# Patient Record
Sex: Male | Born: 1960 | Hispanic: No | Marital: Single | Smoking: Current every day smoker
Health system: Southern US, Community
[De-identification: ages and names within clinical notes are randomized; demographics above are authoritative.]

## PROBLEM LIST (undated history)

## (undated) HISTORY — PX: NO PAST SURGERIES: SHX2092

---

## 2018-06-10 ENCOUNTER — Other Ambulatory Visit: Payer: Self-pay

## 2018-06-10 ENCOUNTER — Encounter (HOSPITAL_COMMUNITY): Payer: Self-pay

## 2018-06-10 ENCOUNTER — Emergency Department (HOSPITAL_COMMUNITY): Payer: Self-pay

## 2018-06-10 ENCOUNTER — Emergency Department (HOSPITAL_COMMUNITY)
Admission: EM | Admit: 2018-06-10 | Discharge: 2018-06-10 | Disposition: A | Discharge status: Home / Self Care | Payer: Self-pay | Attending: Emergency Medicine | Admitting: Emergency Medicine

## 2018-06-10 DIAGNOSIS — Z23 Encounter for immunization: Secondary | ICD-10-CM | POA: Insufficient documentation

## 2018-06-10 DIAGNOSIS — Y9389 Activity, other specified: Secondary | ICD-10-CM | POA: Insufficient documentation

## 2018-06-10 DIAGNOSIS — W230XXA Caught, crushed, jammed, or pinched between moving objects, initial encounter: Secondary | ICD-10-CM | POA: Insufficient documentation

## 2018-06-10 DIAGNOSIS — S61215A Laceration without foreign body of left ring finger without damage to nail, initial encounter: Secondary | ICD-10-CM | POA: Insufficient documentation

## 2018-06-10 DIAGNOSIS — Y999 Unspecified external cause status: Secondary | ICD-10-CM | POA: Insufficient documentation

## 2018-06-10 DIAGNOSIS — Y929 Unspecified place or not applicable: Secondary | ICD-10-CM | POA: Insufficient documentation

## 2018-06-10 MED ORDER — BACITRACIN ZINC 500 UNIT/GM EX OINT
1.0000 "application " | TOPICAL_OINTMENT | Freq: Two times a day (BID) | CUTANEOUS | Status: DC
  Administered 2018-06-10: 1 via TOPICAL
  Filled 2018-06-10: qty 0.9

## 2018-06-10 MED ORDER — TETANUS-DIPHTH-ACELL PERTUSSIS 5-2.5-18.5 LF-MCG/0.5 IM SUSP
0.5000 mL | Freq: Once | INTRAMUSCULAR | Status: AC
  Administered 2018-06-10: 0.5 mL via INTRAMUSCULAR
  Filled 2018-06-10: qty 0.5

## 2018-06-10 MED ORDER — LIDOCAINE HCL (PF) 1 % IJ SOLN
30.0000 mL | Freq: Once | INTRAMUSCULAR | Status: AC
  Administered 2018-06-10: 30 mL
  Filled 2018-06-10: qty 30

## 2018-06-10 NOTE — Discharge Instructions (Signed)
Return in 7-10 days for suture removal.  Keep the wound clean and dry.

## 2018-06-10 NOTE — ED Triage Notes (Signed)
Pt arrive POV d/t L. Ring finger lac from machinery

## 2018-06-10 NOTE — ED Provider Notes (Signed)
[facility]   CSN: [id number] Arrival date & time: 06/10/18  [id number]     History   Chief Complaint Chief Complaint  Patient presents with  . Extremity Laceration    L. ring Finger    HPI Alan Stevens is a 57 y.o. male.  Patient presents to the emergency department with a chief complaint of left ring finger laceration.  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  He complains of moderate pain.  Last tetanus shot unknown.  Bleeding controlled with gauze.  The history is provided by the patient. No language interpreter was used.    No past medical history on file.  There are no active problems to display for this patient.   History reviewed. No pertinent surgical history.      Home Medications    Prior to Admission medications   Not on File    Family History No family history on file.  Social History Social History   Tobacco Use  . Smoking status: Not on file  Substance Use Topics  . Alcohol use: Not on file  . Drug use: Not on file     Allergies   Patient has no allergy information on record.   Review of Systems Review of Systems  All other systems reviewed and are negative.    Physical Exam Updated Vital Signs Pulse (!) 101   Temp 98.3 F (36.8 C) (Oral)   Resp (!) 108   SpO2 99%   Physical Exam Nursing note and vitals reviewed.  Constitutional: Pt appears well-developed and well-nourished. No distress.  HENT:  Head: Normocephalic and atraumatic.  Eyes: Conjunctivae are normal.  Neck: Normal range of motion.  Cardiovascular: Normal rate, regular rhythm. Intact distal pulses.   Capillary refill < 3 sec.  Pulmonary/Chest: Effort normal and breath sounds normal.  Musculoskeletal:  Left hand Pt exhibits laceration to the base of the left ring finger anteriorly.   ROM: 5/5  Strength: 5/5  Neurological: Pt  is  alert. Coordination normal.  Sensation: 5/5 Skin: 1.5 cm laceration to the anterior base of the left 4th finger, no FB, no visible tendon or joint involvement Psychiatric: Pt has a normal mood and affect.     ED Treatments / Results  Labs (all labs ordered are listed, but only abnormal results are displayed) Labs Reviewed - No data to display  EKG None  Radiology No results found.  Procedures Procedures (including critical care time) LACERATION REPAIR Performed by: Evelena LeydenGarrett Green, PA-S Authorized by: Roxy Horsemanobert Tenzin Edelman Consent: Verbal consent obtained. Risks and benefits: risks, benefits and alternatives were discussed Consent given by: patient Patient identity confirmed: provided demographic data Prepped and Draped in normal sterile fashion Wound explored  Laceration Location: 4th finger  Laceration Length: 1.5cm  No Foreign Bodies seen or palpated  Anesthesia: Digital block  Local anesthetic: lidocaine 1% without epinephrine  Anesthetic total: 5 ml  Irrigation method: syringe Amount of cleaning: standard  Skin closure: 4-0 prolene  Number of sutures: 4  Technique: interrupted  Patient tolerance: Patient tolerated the procedure well with no immediate complications.  Medications Ordered in ED Medications  Tdap (BOOSTRIX) injection 0.5 mL (has no administration in time range)  lidocaine (PF) (XYLOCAINE) 1 % injection 30 mL (has no administration in time range)     Initial Impression / Assessment and Plan / ED Course  I have reviewed the triage vital signs and  the nursing notes.  Pertinent labs & imaging results that were available during my care of the patient were reviewed by me and considered in my medical decision making (see chart for details).     Patient with laceration of the left 4th finger.  Lac repaired in ED.  No obvious tendon involvement.  Normal ROM and strength.  Tdap updated.  Sutures out in 7-10 days.  Final Clinical Impressions(s) / ED  Diagnoses   Final diagnoses:  Laceration of left ring finger without foreign body without damage to nail, initial encounter    ED Discharge Orders    None       Roxy Horseman, PA-C 06/10/18 [id number]    Dione Booze, MD 06/10/18 [phone]

## 2018-06-21 ENCOUNTER — Emergency Department (HOSPITAL_COMMUNITY)
Admission: EM | Admit: 2018-06-21 | Discharge: 2018-06-21 | Disposition: A | Discharge status: Home / Self Care | Payer: Self-pay | Attending: Emergency Medicine | Admitting: Emergency Medicine

## 2018-06-21 DIAGNOSIS — Z4802 Encounter for removal of sutures: Secondary | ICD-10-CM | POA: Insufficient documentation

## 2018-06-21 DIAGNOSIS — F17210 Nicotine dependence, cigarettes, uncomplicated: Secondary | ICD-10-CM | POA: Insufficient documentation

## 2018-06-21 NOTE — Discharge Instructions (Signed)
Please read and follow all provided instructions.  Your diagnoses today include:  1. Visit for suture removal     Tests performed today include:  Vital signs. See below for your results today.   Medications prescribed:   None  Take any prescribed medications only as directed.   Home care instructions:  Follow any educational materials and wound care instructions contained in this packet.   Follow-up instructions: Please contact the orthopedic hand doctor referral if you have any concerns about your finger being weak.  Return instructions:  Return to the Emergency Department if you have:  Fever  Worsening pain  Worsening swelling of the wound  Pus draining from the wound  Redness of the skin that moves away from the wound, especially if it streaks away from the affected area   Any other emergent concerns  Your vital signs today were: BP (!) 142/86 (BP Location: Right Arm)    Pulse 81    Temp 98.9 F (37.2 C) (Oral)    Resp 18    Ht 6' (1.829 m)    SpO2 100%    BMI 24.41 kg/m  If your blood pressure (BP) was elevated above 135/85 this visit, please have this repeated by your doctor within one month. --------------

## 2018-06-21 NOTE — ED Notes (Signed)
Declined W/C at D/C and was escorted to lobby by RN. 

## 2018-06-21 NOTE — ED Provider Notes (Signed)
[facility] EMERGENCY DEPARTMENT Provider Note   CSN: [id number] Arrival date & time: 06/21/18  1833     History   Chief Complaint No chief complaint on file.   HPI Alan Stevens is a 57 y.o. male.  Patient presents emergency department for suture removal.  Patient was seen in the emergency department 11 days ago.  He sustained a laceration to the base of his left ring finger.  He states that the wound has been healing well.  He states that he has had persistent weakness in flexion and the ring finger.  He also has some decreased sensation at the fingertip itself.  No redness or drainage.  Onset of symptoms acute.  Course is improving.     No past medical history on file.  There are no active problems to display for this patient.   No past surgical history on file.      Home Medications    Prior to Admission medications   Not on File    Family History No family history on file.  Social History Social History   Tobacco Use  . Smoking status: Current Every Alan Smoker    Packs/Alan: 0.50    Types: Cigarettes  . Smokeless tobacco: Never Used  Substance Use Topics  . Alcohol use: Yes    Alcohol/week: 1.0 standard drinks    Types: 1 Cans of beer per week  . Drug use: Not on file     Allergies   Patient has no allergy information on record.   Review of Systems Review of Systems  Constitutional: Negative for activity change.  Musculoskeletal: Negative for arthralgias, back pain, gait problem, joint swelling and neck pain.  Skin: Positive for wound.  Neurological: Positive for weakness. Negative for numbness.     Physical Exam Updated Vital Signs BP (!) 142/86 (BP Location: Right Arm)   Pulse 81   Temp 98.9 F (37.2 C) (Oral)   Resp 18   Ht 6' (1.829 m)   SpO2 100%   BMI 24.41 kg/m   Physical Exam  Constitutional: He appears well-developed and well-nourished.  HENT:  Head: Normocephalic and atraumatic.  Eyes: Conjunctivae  are normal.  Neck: Normal range of motion. Neck supple.  Cardiovascular: Normal pulses. Exam reveals no decreased pulses.  Musculoskeletal: He exhibits tenderness. He exhibits no edema.  Patient with slightly decreased strength against active resistance in the ring finger on the left hand.  Wound appears to be healing well.  Patient with normal range of motion.  He does feel sharp and dull along the course of the finger bilaterally.  No full numbness detected.  Neurological: He is alert. A sensory deficit (Reports decreased sensation) is present.  Skin: Skin is warm and dry.  Psychiatric: He has a normal mood and affect.  Nursing note and vitals reviewed.    ED Treatments / Results  Labs (all labs ordered are listed, but only abnormal results are displayed) Labs Reviewed - No data to display  EKG None  Radiology No results found.  Procedures .Suture Removal Date/Time: 06/21/2018 7:12 PM Performed by: Renne Crigler, PA-C Authorized by: Renne Crigler, PA-C   Consent:    Consent obtained:  Verbal   Consent given by:  Patient   Risks discussed:  Pain Procedure details:    Wound appearance:  No signs of infection, clean and good wound healing   Number of sutures removed:  4 Post-procedure details:    Post-removal:  No dressing applied   Patient  tolerance of procedure:  Tolerated well, no immediate complications   (including critical care time)  Medications Ordered in ED Medications - No data to display   Initial Impression / Assessment and Plan / ED Course  I have reviewed the triage vital signs and the nursing notes.  Pertinent labs & imaging results that were available during my care of the patient were reviewed by me and considered in my medical decision making (see chart for details).     Patient seen and examined.  Reviewed note from previous visit.  No tendon injury seen at time of exam.  Patient does have some residual weakness when compared to other fingers  today.  He has some decreased sensation in the finger but no full numbness that I can elicit.  Sutures removed without complication.  Discussed continued wound care.  Encouraged patient to follow-up with orthopedic hand physician for another opinion if he is concerned about any residual weakness.  Referral given.  Vital signs reviewed and are as follows: BP (!) 142/86 (BP Location: Right Arm)   Pulse 81   Temp 98.9 F (37.2 C) (Oral)   Resp 18   Ht 6' (1.829 m)   SpO2 100%   BMI 24.41 kg/m     Final Clinical Impressions(s) / ED Diagnoses   Final diagnoses:  Visit for suture removal   Patient with suture removal without complication.  Wound appears to be healing very well.  Follow-up as discussed above.  ED Discharge Orders    None       Renne CriglerGeiple, Seng Larch, Cordelia Poche-C 06/21/18 [id number]    Marily MemosMesner, Jason, MD 06/21/18 2022

## 2020-10-10 ENCOUNTER — Observation Stay (HOSPITAL_COMMUNITY)
Admission: EM | Admit: 2020-10-10 | Discharge: 2020-10-12 | Disposition: A | Discharge status: Home / Self Care | Payer: 59 | Attending: Surgery | Admitting: Surgery

## 2020-10-10 ENCOUNTER — Emergency Department (HOSPITAL_COMMUNITY): Payer: 59

## 2020-10-10 ENCOUNTER — Other Ambulatory Visit: Payer: Self-pay

## 2020-10-10 ENCOUNTER — Encounter (HOSPITAL_COMMUNITY): Payer: Self-pay | Admitting: Emergency Medicine

## 2020-10-10 DIAGNOSIS — S02611A Fracture of condylar process of right mandible, initial encounter for closed fracture: Secondary | ICD-10-CM | POA: Insufficient documentation

## 2020-10-10 DIAGNOSIS — S2242XA Multiple fractures of ribs, left side, initial encounter for closed fracture: Secondary | ICD-10-CM

## 2020-10-10 DIAGNOSIS — S50812A Abrasion of left forearm, initial encounter: Secondary | ICD-10-CM

## 2020-10-10 DIAGNOSIS — S0993XA Unspecified injury of face, initial encounter: Secondary | ICD-10-CM | POA: Diagnosis present

## 2020-10-10 DIAGNOSIS — J939 Pneumothorax, unspecified: Secondary | ICD-10-CM | POA: Insufficient documentation

## 2020-10-10 DIAGNOSIS — F17210 Nicotine dependence, cigarettes, uncomplicated: Secondary | ICD-10-CM | POA: Diagnosis not present

## 2020-10-10 DIAGNOSIS — S00411A Abrasion of right ear, initial encounter: Secondary | ICD-10-CM

## 2020-10-10 DIAGNOSIS — S2243XA Multiple fractures of ribs, bilateral, initial encounter for closed fracture: Secondary | ICD-10-CM | POA: Diagnosis not present

## 2020-10-10 DIAGNOSIS — S0181XA Laceration without foreign body of other part of head, initial encounter: Principal | ICD-10-CM | POA: Insufficient documentation

## 2020-10-10 DIAGNOSIS — Z20822 Contact with and (suspected) exposure to covid-19: Secondary | ICD-10-CM | POA: Diagnosis not present

## 2020-10-10 DIAGNOSIS — S80211A Abrasion, right knee, initial encounter: Secondary | ICD-10-CM

## 2020-10-10 DIAGNOSIS — K769 Liver disease, unspecified: Secondary | ICD-10-CM

## 2020-10-10 DIAGNOSIS — J9690 Respiratory failure, unspecified, unspecified whether with hypoxia or hypercapnia: Secondary | ICD-10-CM

## 2020-10-10 DIAGNOSIS — E041 Nontoxic single thyroid nodule: Secondary | ICD-10-CM

## 2020-10-10 DIAGNOSIS — R030 Elevated blood-pressure reading, without diagnosis of hypertension: Secondary | ICD-10-CM | POA: Insufficient documentation

## 2020-10-10 DIAGNOSIS — S2249XA Multiple fractures of ribs, unspecified side, initial encounter for closed fracture: Secondary | ICD-10-CM | POA: Diagnosis present

## 2020-10-10 DIAGNOSIS — S02610B Fracture of condylar process of mandible, unspecified side, initial encounter for open fracture: Secondary | ICD-10-CM

## 2020-10-10 DIAGNOSIS — S2241XA Multiple fractures of ribs, right side, initial encounter for closed fracture: Secondary | ICD-10-CM

## 2020-10-10 MED ORDER — LIDOCAINE-EPINEPHRINE 1 %-1:100000 IJ SOLN
20.0000 mL | Freq: Once | INTRAMUSCULAR | Status: AC
  Administered 2020-10-11: 20 mL
  Filled 2020-10-10: qty 1
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
## 2020-10-11 ENCOUNTER — Emergency Department (HOSPITAL_COMMUNITY): Payer: 59

## 2020-10-11 DIAGNOSIS — S2249XA Multiple fractures of ribs, unspecified side, initial encounter for closed fracture: Secondary | ICD-10-CM | POA: Diagnosis present

## 2020-10-11 DIAGNOSIS — S02610B Fracture of condylar process of mandible, unspecified side, initial encounter for open fracture: Secondary | ICD-10-CM

## 2020-10-11 LAB — CBC
HCT: 41.5 % (ref 39.0–52.0)
Hemoglobin: 13.4 g/dL (ref 13.0–17.0)
MCH: 21.4 pg — ABNORMAL LOW (ref 26.0–34.0)
MCHC: 32.3 g/dL (ref 30.0–36.0)
MCV: 66.2 fL — ABNORMAL LOW (ref 80.0–100.0)
Platelets: 165 10*3/uL (ref 150–400)
RBC: 6.27 MIL/uL — ABNORMAL HIGH (ref 4.22–5.81)
RDW: 17.9 % — ABNORMAL HIGH (ref 11.5–15.5)
WBC: 14.3 10*3/uL — ABNORMAL HIGH (ref 4.0–10.5)
nRBC: 0 % (ref 0.0–0.2)

## 2020-10-11 LAB — CBC WITH DIFFERENTIAL/PLATELET
Abs Immature Granulocytes: 0.11 10*3/uL — ABNORMAL HIGH (ref 0.00–0.07)
Basophils Absolute: 0.1 10*3/uL (ref 0.0–0.1)
Basophils Relative: 0 %
Eosinophils Absolute: 0.1 10*3/uL (ref 0.0–0.5)
Eosinophils Relative: 0 %
HCT: 44.6 % (ref 39.0–52.0)
Hemoglobin: 13.9 g/dL (ref 13.0–17.0)
Immature Granulocytes: 1 %
Lymphocytes Relative: 7 %
Lymphs Abs: 1.2 10*3/uL (ref 0.7–4.0)
MCH: 21.1 pg — ABNORMAL LOW (ref 26.0–34.0)
MCHC: 31.2 g/dL (ref 30.0–36.0)
MCV: 67.6 fL — ABNORMAL LOW (ref 80.0–100.0)
Monocytes Absolute: 0.9 10*3/uL (ref 0.1–1.0)
Monocytes Relative: 6 %
Neutro Abs: 14 10*3/uL — ABNORMAL HIGH (ref 1.7–7.7)
Neutrophils Relative %: 86 %
Platelets: 174 10*3/uL (ref 150–400)
RBC: 6.6 MIL/uL — ABNORMAL HIGH (ref 4.22–5.81)
RDW: 18.2 % — ABNORMAL HIGH (ref 11.5–15.5)
WBC: 16.4 10*3/uL — ABNORMAL HIGH (ref 4.0–10.5)
nRBC: 0 % (ref 0.0–0.2)

## 2020-10-11 LAB — COMPREHENSIVE METABOLIC PANEL
ALT: 38 U/L (ref 0–44)
AST: 47 U/L — ABNORMAL HIGH (ref 15–41)
Albumin: 3.7 g/dL (ref 3.5–5.0)
Alkaline Phosphatase: 63 U/L (ref 38–126)
Anion gap: 10 (ref 5–15)
BUN: 12 mg/dL (ref 6–20)
CO2: 22 mmol/L (ref 22–32)
Calcium: 8.7 mg/dL — ABNORMAL LOW (ref 8.9–10.3)
Chloride: 105 mmol/L (ref 98–111)
Creatinine, Ser: 0.88 mg/dL (ref 0.61–1.24)
GFR, Estimated: >60 mL/min (ref >60)
Glucose, Bld: 114 mg/dL — ABNORMAL HIGH (ref 70–99)
Potassium: 3.6 mmol/L (ref 3.5–5.1)
Sodium: 137 mmol/L (ref 135–145)
Total Bilirubin: 1.1 mg/dL (ref 0.3–1.2)
Total Protein: 6.4 g/dL — ABNORMAL LOW (ref 6.5–8.1)

## 2020-10-11 LAB — BASIC METABOLIC PANEL
Anion gap: 9 (ref 5–15)
BUN: 12 mg/dL (ref 6–20)
CO2: 24 mmol/L (ref 22–32)
Calcium: 8.5 mg/dL — ABNORMAL LOW (ref 8.9–10.3)
Chloride: 104 mmol/L (ref 98–111)
Creatinine, Ser: 0.92 mg/dL (ref 0.61–1.24)
GFR, Estimated: >60 mL/min (ref >60)
Glucose, Bld: 110 mg/dL — ABNORMAL HIGH (ref 70–99)
Potassium: 3.8 mmol/L (ref 3.5–5.1)
Sodium: 137 mmol/L (ref 135–145)

## 2020-10-11 LAB — I-STAT CREATININE, ED: Creatinine, Ser: 0.9 mg/dL (ref 0.61–1.24)

## 2020-10-11 LAB — LACTIC ACID, PLASMA: Lactic Acid, Venous: 1.6 mmol/L (ref 0.5–1.9)

## 2020-10-11 LAB — HIV ANTIBODY (ROUTINE TESTING W REFLEX): HIV Screen 4th Generation wRfx: NONREACTIVE

## 2020-10-11 LAB — SARS CORONAVIRUS 2 (TAT 6-24 HRS): SARS Coronavirus 2: NEGATIVE

## 2020-10-11 LAB — ETHANOL: Alcohol, Ethyl (B): <10 mg/dL (ref <10)

## 2020-10-11 MED ORDER — SALINE SPRAY 0.65 % NA SOLN
1.0000 | NASAL | Status: DC | PRN
  Filled 2020-10-11: qty 44

## 2020-10-11 MED ORDER — METHOCARBAMOL 500 MG PO TABS
750.0000 mg | ORAL_TABLET | Freq: Three times a day (TID) | ORAL | Status: DC
  Administered 2020-10-11 – 2020-10-12 (×4): 750 mg via ORAL
  Filled 2020-10-11 (×4): qty 2

## 2020-10-11 MED ORDER — IOHEXOL 300 MG/ML  SOLN
75.0000 mL | Freq: Once | INTRAMUSCULAR | Status: AC | PRN
  Administered 2020-10-11: 75 mL via INTRAVENOUS

## 2020-10-11 MED ORDER — KETOROLAC TROMETHAMINE 30 MG/ML IJ SOLN: 30.0000 mg | Freq: Three times a day (TID) | INTRAMUSCULAR | Status: DC | PRN

## 2020-10-11 MED ORDER — ACETAMINOPHEN 325 MG PO TABS: 650.0000 mg | ORAL_TABLET | ORAL | Status: DC | PRN

## 2020-10-11 MED ORDER — ONDANSETRON 4 MG PO TBDP: 4.0000 mg | ORAL_TABLET | Freq: Four times a day (QID) | ORAL | Status: DC | PRN

## 2020-10-11 MED ORDER — METOPROLOL TARTRATE 5 MG/5ML IV SOLN
5.0000 mg | Freq: Four times a day (QID) | INTRAVENOUS | Status: DC | PRN
  Administered 2020-10-11: 5 mg via INTRAVENOUS
  Filled 2020-10-11: qty 5

## 2020-10-11 MED ORDER — OXYCODONE HCL 5 MG PO TABS
5.0000 mg | ORAL_TABLET | ORAL | Status: DC | PRN
Start: 2020-10-11 — End: 2020-10-11

## 2020-10-11 MED ORDER — OXYCODONE HCL 5 MG PO TABS
5.0000 mg | ORAL_TABLET | ORAL | Status: DC | PRN
  Administered 2020-10-12: 10 mg via ORAL
  Filled 2020-10-11: qty 2

## 2020-10-11 MED ORDER — ACETAMINOPHEN 325 MG PO TABS
650.0000 mg | ORAL_TABLET | Freq: Four times a day (QID) | ORAL | Status: DC
  Administered 2020-10-11 – 2020-10-12 (×5): 650 mg via ORAL
  Filled 2020-10-11 (×5): qty 2

## 2020-10-11 MED ORDER — ONDANSETRON HCL 4 MG/2ML IJ SOLN: 4.0000 mg | Freq: Four times a day (QID) | INTRAMUSCULAR | Status: DC | PRN

## 2020-10-11 MED ORDER — KCL IN DEXTROSE-NACL 10-5-0.45 MEQ/L-%-% IV SOLN
INTRAVENOUS | Status: DC
  Filled 2020-10-11 (×2): qty 1000

## 2020-10-11 MED ORDER — MORPHINE SULFATE (PF) 2 MG/ML IV SOLN
2.0000 mg | INTRAVENOUS | Status: DC | PRN
  Administered 2020-10-11: 2 mg via INTRAVENOUS
  Filled 2020-10-11: qty 1

## 2020-10-11 MED ORDER — KETOROLAC TROMETHAMINE 30 MG/ML IJ SOLN
30.0000 mg | Freq: Three times a day (TID) | INTRAMUSCULAR | Status: DC
  Administered 2020-10-11 – 2020-10-12 (×4): 30 mg via INTRAVENOUS
  Filled 2020-10-11 (×4): qty 1

## 2020-10-11 MED ORDER — ENOXAPARIN SODIUM 30 MG/0.3ML ~~LOC~~ SOLN
30.0000 mg | Freq: Two times a day (BID) | SUBCUTANEOUS | Status: DC
  Administered 2020-10-12: 30 mg via SUBCUTANEOUS
  Filled 2020-10-11: qty 0.3

## 2020-10-11 MED ORDER — LIDOCAINE 5 % EX PTCH
1.0000 | MEDICATED_PATCH | CUTANEOUS | Status: DC
  Administered 2020-10-11 – 2020-10-12 (×2): 1 via TRANSDERMAL
  Filled 2020-10-11 (×2): qty 1

## 2020-10-11 MED ORDER — METHOCARBAMOL 500 MG PO TABS: 750.0000 mg | ORAL_TABLET | Freq: Four times a day (QID) | ORAL | Status: DC | PRN

## 2020-10-11 MED ORDER — NICOTINE 14 MG/24HR TD PT24
14.0000 mg | MEDICATED_PATCH | Freq: Every day | TRANSDERMAL | Status: DC
  Administered 2020-10-12: 14 mg via TRANSDERMAL
  Filled 2020-10-11: qty 1

## 2020-10-11 MED ORDER — MORPHINE SULFATE (PF) 2 MG/ML IV SOLN: 1.0000 mg | INTRAVENOUS | Status: DC | PRN

## 2020-10-11 NOTE — ED Notes (Signed)
Transport ordered

## 2020-10-11 NOTE — Plan of Care (Signed)
  Problem: Clinical Measurements: Goal: Ability to maintain clinical measurements within normal limits will improve Outcome: Progressing Goal: Will remain free from infection Outcome: Progressing   Problem: Activity: Goal: Risk for activity intolerance will decrease Outcome: Progressing   
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
## 2020-10-11 NOTE — Progress Notes (Signed)
Received Pt on a stretcher, assisted to bed, vital signs taken and recorded. Pt is alert and oriented x 4, denies pain at this time 0/10, not in distress, family at bedside. Endorsed accordingly to [employer], Charity fundraiser.
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
## 2020-10-12 ENCOUNTER — Observation Stay (HOSPITAL_COMMUNITY): Payer: 59

## 2020-10-12 ENCOUNTER — Encounter (HOSPITAL_COMMUNITY): Payer: Self-pay

## 2020-10-12 LAB — BASIC METABOLIC PANEL
Anion gap: 5 (ref 5–15)
BUN: 15 mg/dL (ref 6–20)
CO2: 27 mmol/L (ref 22–32)
Calcium: 8.4 mg/dL — ABNORMAL LOW (ref 8.9–10.3)
Chloride: 105 mmol/L (ref 98–111)
Creatinine, Ser: 0.93 mg/dL (ref 0.61–1.24)
GFR, Estimated: >60 mL/min (ref >60)
Glucose, Bld: 120 mg/dL — ABNORMAL HIGH (ref 70–99)
Potassium: 3.5 mmol/L (ref 3.5–5.1)
Sodium: 137 mmol/L (ref 135–145)

## 2020-10-12 LAB — CBC
HCT: 41.3 % (ref 39.0–52.0)
Hemoglobin: 13 g/dL (ref 13.0–17.0)
MCH: 21 pg — ABNORMAL LOW (ref 26.0–34.0)
MCHC: 31.5 g/dL (ref 30.0–36.0)
MCV: 66.7 fL — ABNORMAL LOW (ref 80.0–100.0)
Platelets: 163 10*3/uL (ref 150–400)
RBC: 6.19 MIL/uL — ABNORMAL HIGH (ref 4.22–5.81)
RDW: 17.2 % — ABNORMAL HIGH (ref 11.5–15.5)
WBC: 11.2 10*3/uL — ABNORMAL HIGH (ref 4.0–10.5)
nRBC: 0 % (ref 0.0–0.2)

## 2020-10-12 MED ORDER — OXYCODONE HCL 5 MG PO TABS
5.0000 mg | ORAL_TABLET | Freq: Four times a day (QID) | ORAL | 0 refills | Status: AC | PRN
Start: 2020-10-12 — End: ?

## 2020-10-12 MED ORDER — SALINE SPRAY 0.65 % NA SOLN: 1.0000 | NASAL | 0 refills | Status: AC | PRN

## 2020-10-12 MED ORDER — METHOCARBAMOL 750 MG PO TABS
750.0000 mg | ORAL_TABLET | Freq: Three times a day (TID) | ORAL | 0 refills | Status: AC | PRN
Start: 2020-10-12 — End: ?

## 2020-10-12 NOTE — Discharge Instructions (Signed)
For mandibular condyle (jaw bone) fracture: Fracture precautions: 1. Elevate head of bed 2. Ice compress to periorbital region 3. Avoid additional trauma, nose blowing or sneezing 4. Liquid and soft diet ONLY as tolerated 5. Saline nasal spray 4 times a day and when necessary  1. Limited activity 2. Liquid and soft diet 3. May bathe and shower, keep incision dry for 3 days postop 4. Wound care - 1/2 str H2O2 and bacitracin ointment twice daily 5. Elevate Head of Bed 6. Ice compress for 24 hrs  Please contact [facility] [phone]) for 2-week follow-up.    Recommend purchasing a blood pressure cuff at the pharmacy and start checking your blood pressure twice daily. Keep a record and discuss findings when you see your primary care physician.    RIB FRACTURES  HOME INSTRUCTIONS   1. PAIN CONTROL:  1. Pain is best controlled by a usual combination of three different methods TOGETHER:  i. Ice/Heat ii. Over the counter pain medication iii. Prescription pain medication 2. You may experience some swelling and bruising in area of broken ribs. Ice packs or heating pads (30-60 minutes up to 6 times a day) will help. Use ice for the first few days to help decrease swelling and bruising, then switch to heat to help relax tight/sore spots and speed recovery. Some people prefer to use ice alone, heat alone, alternating between ice & heat. Experiment to what works for you. Swelling and bruising can take several weeks to resolve.  3. It is helpful to take an over-the-counter pain medication regularly for the first few weeks. Choose one of the following that works best for you:  i. Naproxen (Aleve, etc) Two 220mg  tabs twice a day ii. Ibuprofen (Advil, etc) Three 200mg  tabs four times a day (every meal & bedtime) iii. Acetaminophen (Tylenol, etc) 500-650mg  four times a day (every meal & bedtime) 4. A prescription for pain medication (such as oxycodone, hydrocodone, etc) may be given to you  upon discharge. Take your pain medication as prescribed.  i. If you are having problems/concerns with the prescription medicine (does not control pain, nausea, vomiting, rash, itching, etc), please call [phone] to see if we need to switch you to a different pain medicine that will work better for you and/or control your side effect better. ii. If you need a refill on your pain medication, please contact your pharmacy. They will contact our office to request authorization. Prescriptions will not be filled after 5 pm or on week-ends. 1. Avoid getting constipated. When taking pain medications, it is common to experience some constipation. Increasing fluid intake and taking a fiber supplement (such as Metamucil, Citrucel, FiberCon, MiraLax, etc) 1-2 times a day regularly will usually help prevent this problem from occurring. A mild laxative (prune juice, Milk of Magnesia, MiraLax, etc) should be taken according to package directions if there are no bowel movements after 48 hours.  2. Watch out for diarrhea. If you have many loose bowel movements, simplify your diet to bland foods & liquids for a few days. Stop any stool softeners and decrease your fiber supplement. Switching to mild anti-diarrheal medications (Kayopectate, Pepto Bismol) can help. If this worsens or does not improve, please call us. 3. FOLLOW UP  a. If a follow up appointment is needed one will be scheduled for you. If none is needed with our trauma team, please follow up with your primary care provider within 2-3 weeks from discharge. Please call [facility] at [phone] if you have  any questions about follow up.  b. If you have any orthopedic or other injuries you will need to follow up as outlined in your follow up instructions.   WHEN TO CALL us [phone]:  1. Poor pain control 2. Reactions / problems with new medications (rash/itching, nausea, etc)  3. Fever over 101.5 F (38.5 C) 4. Worsening swelling or  bruising 5. Worsening pain, productive cough, difficulty breathing or any other concerning symptoms  The clinic staff is available to answer your questions during regular business hours (8:30am-5pm). Please don't hesitate to call and ask to speak to one of our nurses for clinical concerns.  If you have a medical emergency, go to the nearest emergency room or call 911.  A surgeon from [facility] is always on call at the [facility], [place]  [address], [place], [place] [address] ?  MAIN: [phone] ? TOLL FREE: [phone] ?  FAX [phone]  [email or web address]      Information on Rib Fractures  A rib fracture is a break or crack in one of the bones of the ribs. The ribs are long, curved bones that wrap around your chest and attach to your spine and your breastbone. The ribs protect your heart, lungs, and other organs in the chest. A broken or cracked rib is often painful but is not usually serious. Most rib fractures heal on their own over time. However, rib fractures can be more serious if multiple ribs are broken or if broken ribs move out of place and push against other structures or organs. What are the causes? This condition is caused by:  Repetitive movements with high force, such as pitching a baseball or having severe coughing spells.  A direct blow to the chest, such as a sports injury, a car accident, or a fall.  Cancer that has spread to the bones, which can weaken bones and cause them to break. What are the signs or symptoms? Symptoms of this condition include:  Pain when you breathe in or cough.  Pain when someone presses on the injured area.  Feeling short of breath. How is this diagnosed? This condition is diagnosed with a physical exam and medical history. Imaging tests may also be done, such as:  Chest X-ray.  CT scan.  MRI.  Bone scan.  Chest ultrasound. How is this  treated? Treatment for this condition depends on the severity of the fracture. Most rib fractures usually heal on their own in 1-3 months. Sometimes healing takes longer if there is a cough that does not stop or if there are other activities that make the injury worse (aggravating factors). While you heal, you will be given medicines to control the pain. You will also be taught deep breathing exercises. Severe injuries may require hospitalization or surgery. Follow these instructions at home: Managing pain, stiffness, and swelling  If directed, apply ice to the injured area. ? Put ice in a plastic bag. ? Place a towel between your skin and the bag. ? Leave the ice on for 20 minutes, 2-3 times a day.  Take over-the-counter and prescription medicines only as told by your health care provider. Activity  Avoid a lot of activity and any activities or movements that cause pain. Be careful during activities and avoid bumping the injured rib.  Slowly increase your activity as told by your health care provider. General instructions  Do deep breathing exercises as told by your health care provider.  This helps prevent pneumonia, which is a common complication of a broken rib. Your health care provider may instruct you to: ? Take deep breaths several times a day. ? Try to cough several times a day, holding a pillow against the injured area. ? Use a device called incentive spirometer to practice deep breathing several times a day.  Drink enough fluid to keep your urine pale yellow.  Do not wear a rib belt or binder. These restrict breathing, which can lead to pneumonia.  Keep all follow-up visits as told by your health care provider. This is important. Contact a health care provider if:  You have a fever. Get help right away if:  You have difficulty breathing or you are short of breath.  You develop a cough that does not stop, or you cough up thick or bloody sputum.  You have nausea,  vomiting, or pain in your abdomen.  Your pain gets worse and medicine does not help. Summary  A rib fracture is a break or crack in one of the bones of the ribs.  A broken or cracked rib is often painful but is not usually serious.  Most rib fractures heal on their own over time.  Treatment for this condition depends on the severity of the fracture.  Avoid a lot of activity and any activities or movements that cause pain. This information is not intended to replace advice given to you by your health care provider. Make sure you discuss any questions you have with your health care provider. Document Released: 07/09/2005 Document Revised: 10/08/2016 Document Reviewed: 10/08/2016 Elsevier Interactive Patient Education  2019 [employer].
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
## 2020-10-26 DIAGNOSIS — R040 Epistaxis: Secondary | ICD-10-CM | POA: Insufficient documentation

## 2020-11-11 DIAGNOSIS — K4090 Unilateral inguinal hernia, without obstruction or gangrene, not specified as recurrent: Secondary | ICD-10-CM | POA: Insufficient documentation

## 2020-12-08 ENCOUNTER — Ambulatory Visit: Payer: 59 | Admitting: Family Medicine

## 2020-12-08 DIAGNOSIS — I10 Essential (primary) hypertension: Secondary | ICD-10-CM | POA: Insufficient documentation
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
## 2024-07-03 ENCOUNTER — Encounter: Payer: Self-pay | Admitting: Psychology

## 2024-08-25 ENCOUNTER — Encounter: Admitting: Psychology

## 2024-08-31 ENCOUNTER — Encounter: Admitting: Psychology
